# Patient Record
Sex: Female | Born: 1995 | Race: White | Hispanic: No | Marital: Single | Smoking: Never smoker
Health system: Southern US, Community
[De-identification: ages and names within clinical notes are randomized; demographics above are authoritative.]

## PROBLEM LIST (undated history)

## (undated) HISTORY — PX: WISDOM TOOTH EXTRACTION: SHX21

## (undated) HISTORY — PX: TONSILLECTOMY: SUR1361

---

## 2015-01-24 ENCOUNTER — Emergency Department
Admission: EM | Admit: 2015-01-24 | Discharge: 2015-01-24 | Discharge status: Absent without leave | Payer: BLUE CROSS/BLUE SHIELD | Source: Home / Self Care

## 2018-10-21 ENCOUNTER — Emergency Department (HOSPITAL_COMMUNITY)
Admission: EM | Admit: 2018-10-21 | Discharge: 2018-10-21 | Disposition: A | Discharge status: Home / Self Care | Payer: 59 | Source: Home / Self Care

## 2018-10-21 ENCOUNTER — Encounter (HOSPITAL_COMMUNITY): Payer: Self-pay

## 2018-10-21 ENCOUNTER — Other Ambulatory Visit: Payer: Self-pay

## 2018-10-21 ENCOUNTER — Emergency Department (HOSPITAL_COMMUNITY)
Admission: EM | Admit: 2018-10-21 | Discharge: 2018-10-21 | Disposition: A | Discharge status: Home / Self Care | Payer: 59 | Attending: Emergency Medicine | Admitting: Emergency Medicine

## 2018-10-21 ENCOUNTER — Emergency Department (HOSPITAL_COMMUNITY): Payer: 59

## 2018-10-21 DIAGNOSIS — Y9389 Activity, other specified: Secondary | ICD-10-CM | POA: Insufficient documentation

## 2018-10-21 DIAGNOSIS — S51811A Laceration without foreign body of right forearm, initial encounter: Secondary | ICD-10-CM | POA: Diagnosis not present

## 2018-10-21 DIAGNOSIS — Y929 Unspecified place or not applicable: Secondary | ICD-10-CM | POA: Insufficient documentation

## 2018-10-21 DIAGNOSIS — W260XXA Contact with knife, initial encounter: Secondary | ICD-10-CM | POA: Insufficient documentation

## 2018-10-21 DIAGNOSIS — Z23 Encounter for immunization: Secondary | ICD-10-CM | POA: Diagnosis not present

## 2018-10-21 DIAGNOSIS — Y999 Unspecified external cause status: Secondary | ICD-10-CM | POA: Diagnosis not present

## 2018-10-21 MED ORDER — TETANUS-DIPHTH-ACELL PERTUSSIS 5-2.5-18.5 LF-MCG/0.5 IM SUSP
0.5000 mL | Freq: Once | INTRAMUSCULAR | Status: AC
  Administered 2018-10-21: 0.5 mL via INTRAMUSCULAR
  Filled 2018-10-21: qty 0.5

## 2018-10-21 MED ORDER — LIDOCAINE-EPINEPHRINE (PF) 1 %-1:200000 IJ SOLN
10.0000 mL | Freq: Once | INTRAMUSCULAR | Status: DC
  Filled 2018-10-21: qty 30

## 2018-10-21 NOTE — ED Triage Notes (Signed)
Pt was skinning a deer and knife slipped, cut right forearm lac, 2 inch horizontal lac above right wrist. Pt states 'spurting' but bleeding controlled by the time EMS arrived. brother applied tourniquet @ 1815.

## 2018-10-21 NOTE — Discharge Instructions (Signed)
You were seen in the ED today with a forearm laceration. This was repaired with stitches that will need to come out in 7 days. You can return to the ED for removal or go to your PCP. Return to the ED with any sudden worsening pain, numbness, redness around the wound, drainage from the wound, or other sign of infection.

## 2018-10-21 NOTE — ED Provider Notes (Signed)
Emergency Department Provider Note   I have reviewed the triage vital signs and the nursing notes.   HISTORY  Chief Complaint Laceration   HPI Jocelyn Bray is a 22 y.o. female presents to the emergency department by EMS after sustaining a laceration to the right forearm.  Patient was hunting with family and cleaning a deer when the knife slipped and inadvertently cut her in the right wrist.  Patient notes significant bleeding on scene.  Her brother is a Emergency planning/management officerpolice officer and applied a belt-like a tourniquet to the right forearm and called paramedics.  Paramedics evaluated the wound, irrigated, dressed, and transported to the emergency department.  Patient denies any numbness or tingling in right hand or forearm.  No injury to other parts of the body.  Unsure of her last tetanus shot.    History reviewed. No pertinent past medical history.  There are no active problems to display for this patient.   Past Surgical History:  Procedure Laterality Date  . TONSILLECTOMY    . WISDOM TOOTH EXTRACTION      Allergies Patient has no allergy information on record.  History reviewed. No pertinent family history.  Social History Social History   Tobacco Use  . Smoking status: Never Smoker  Substance Use Topics  . Alcohol use: Not on file    Comment: occ  . Drug use: Not on file    Review of Systems  Constitutional: No fever/chills Eyes: No visual changes. ENT: No sore throat. Cardiovascular: Denies chest pain. Respiratory: Denies shortness of breath. Gastrointestinal: No abdominal pain.  No nausea, no vomiting.  No diarrhea.  No constipation. Genitourinary: Negative for dysuria. Musculoskeletal: Negative for back pain. Skin: Negative for rash. Right forearm laceration.  Neurological: Negative for headaches, focal weakness or numbness.  10-point ROS otherwise negative.  ____________________________________________   PHYSICAL EXAM:  VITAL SIGNS: ED Triage Vitals    Enc Vitals Group     BP 10/21/18 1922 (!) 143/106     Pulse Rate 10/21/18 1922 75     Resp 10/21/18 1922 18     Temp 10/21/18 1922 98.7 F (37.1 C)     Temp Source 10/21/18 1922 Oral     SpO2 10/21/18 1922 99 %     Weight 10/21/18 1923 210 lb (95.3 kg)     Height 10/21/18 1923 5\' 6"  (1.676 m)     Pain Score 10/21/18 1923 8   Constitutional: Alert and oriented. Well appearing and in no acute distress. Eyes: Conjunctivae are normal.  Head: Atraumatic. Nose: No congestion/rhinnorhea. Mouth/Throat: Mucous membranes are moist.  Oropharynx non-erythematous. Neck: No stridor.  Cardiovascular: Normal rate, regular rhythm. Good peripheral circulation. Grossly normal heart sounds.   Respiratory: Normal respiratory effort.  No retractions. Lungs CTAB. Gastrointestinal: No distention.  Musculoskeletal: No lower extremity tenderness nor edema. No gross deformities of extremities. Normal flexor and extensor strength in the forearm and hand on the right.  Neurologic:  Normal speech and language. No gross focal neurologic deficits are appreciated.  Skin:  Skin is warm and dry. 4 cm laceration to the right forearm. Palpable right radial pulse.   ____________________________________________  RADIOLOGY  Dg Forearm Right  Result Date: 10/21/2018 CLINICAL DATA:  Initial evaluation for acute trauma, laceration at forearm. EXAM: RIGHT FOREARM - 2 VIEW COMPARISON:  None. FINDINGS: Soft tissue laceration present at the anterior aspect of the distal right forearm. No radiopaque foreign body. No acute fracture or dislocation. IMPRESSION: 1. Soft tissue laceration at the anterior aspect  of the distal right forearm. No radiopaque foreign body. 2. No acute osseous abnormality. Electronically Signed   By: Rise Mu M.D.   On: 10/21/2018 20:12    ____________________________________________   PROCEDURES  Procedure(s) performed:   Marland KitchenMarland KitchenLaceration Repair Date/Time: 10/21/2018 8:40 PM Performed  by: Maia Plan, MD Authorized by: Maia Plan, MD   Consent:    Consent obtained:  Verbal   Consent given by:  Patient   Risks discussed:  Infection, need for additional repair, nerve damage, poor wound healing, poor cosmetic result, pain, retained foreign body, tendon damage and vascular damage   Alternatives discussed:  No treatment Anesthesia (see MAR for exact dosages):    Anesthesia method:  Local infiltration   Local anesthetic:  Lidocaine 1% WITH epi Laceration details:    Location:  Shoulder/arm   Shoulder/arm location:  R lower arm   Length (cm):  6 Repair type:    Repair type:  Intermediate Pre-procedure details:    Preparation:  Patient was prepped and draped in usual sterile fashion and imaging obtained to evaluate for foreign bodies Exploration:    Hemostasis achieved with:  Direct pressure   Wound exploration: wound explored through full range of motion and entire depth of wound probed and visualized     Wound extent: no foreign bodies/material noted, no muscle damage noted, no nerve damage noted, no tendon damage noted, no underlying fracture noted and no vascular damage noted     Contaminated: no   Treatment:    Area cleansed with:  Betadine and saline   Amount of cleaning:  Standard   Irrigation solution:  Sterile saline   Irrigation volume:  1000 ml   Visualized foreign bodies/material removed: no   Skin repair:    Repair method:  Sutures   Suture size:  4-0   Suture material:  Prolene   Suture technique:  Simple interrupted   Number of sutures:  6 Approximation:    Approximation:  Close Post-procedure details:    Dressing:  Open (no dressing)   Patient tolerance of procedure:  Tolerated well, no immediate complications     ____________________________________________   INITIAL IMPRESSION / ASSESSMENT AND PLAN / ED COURSE  Pertinent labs & imaging results that were available during my care of the patient were reviewed by me and considered in  my medical decision making (see chart for details).  Patient presents to the ED with accidental laceration to the right wrist. Hemostatic on arrival. Belt applied to arm on arrival but not tight enough to constrict arterial flow. Belt removed. Wound hemastatic. Normal distal arterial pulse and capillary refill. Normal strength and sensation. Wound irrigated copiously and cleaned. Laceration repair as above.   09:00 PM Shortly after discharge the patient returns with additional bleeding. Patient with hematoma under the laceration. Wound is still well-approximated with sutures intact. Normal radial pulse distal to the hematoma. Normal perfusion of the hand. Pressure applied and wrapped with coban dressing. Patient observed in the ED for additional period of time and ultimately discharged. Distal pulse, cap refill, and sensation are all intact with dressing in place. Discussed suture removal timeframe and ED return precautions.    ____________________________________________  FINAL CLINICAL IMPRESSION(S) / ED DIAGNOSES  Final diagnoses:  Laceration of right forearm, initial encounter     MEDICATIONS GIVEN DURING THIS VISIT:  Medications  Tdap (BOOSTRIX) injection 0.5 mL (0.5 mLs Intramuscular Given 10/21/18 1943)    Note:  This document was prepared using Dragon voice recognition software and  may include unintentional dictation errors.  Alona Bene, MD Emergency Medicine    Madalene Mickler, Arlyss Repress, MD 10/22/18 [phone]

## 2019-06-19 IMAGING — DX DG FOREARM 2V*R*
2 series · 2 of 2 positions shown · non-contrast
Comparison: None.

CLINICAL DATA: Initial evaluation for acute trauma, laceration at
forearm.

EXAM:
RIGHT FOREARM - 2 VIEW

[forearm ap]
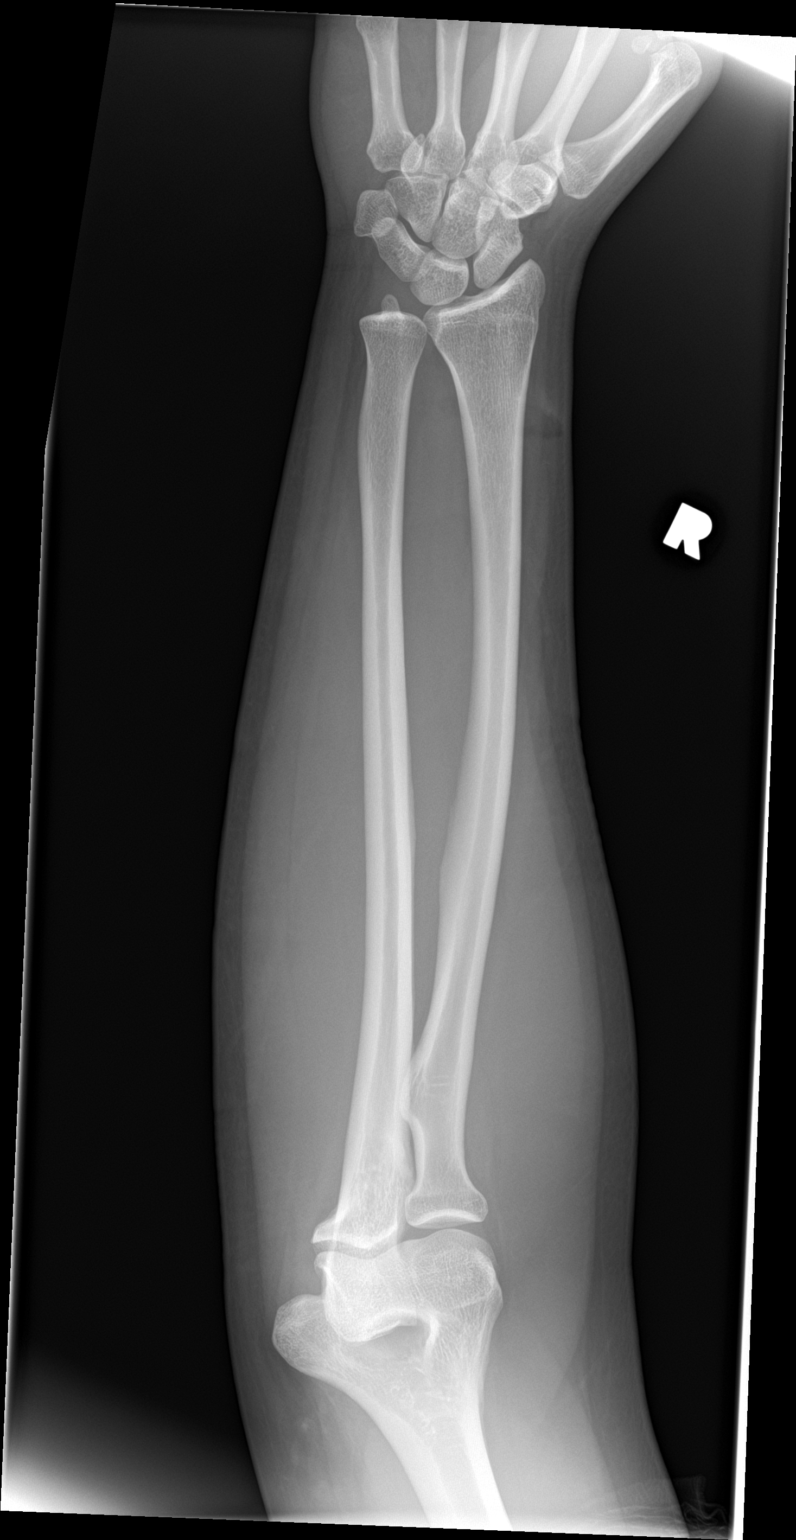

[forearm lat]
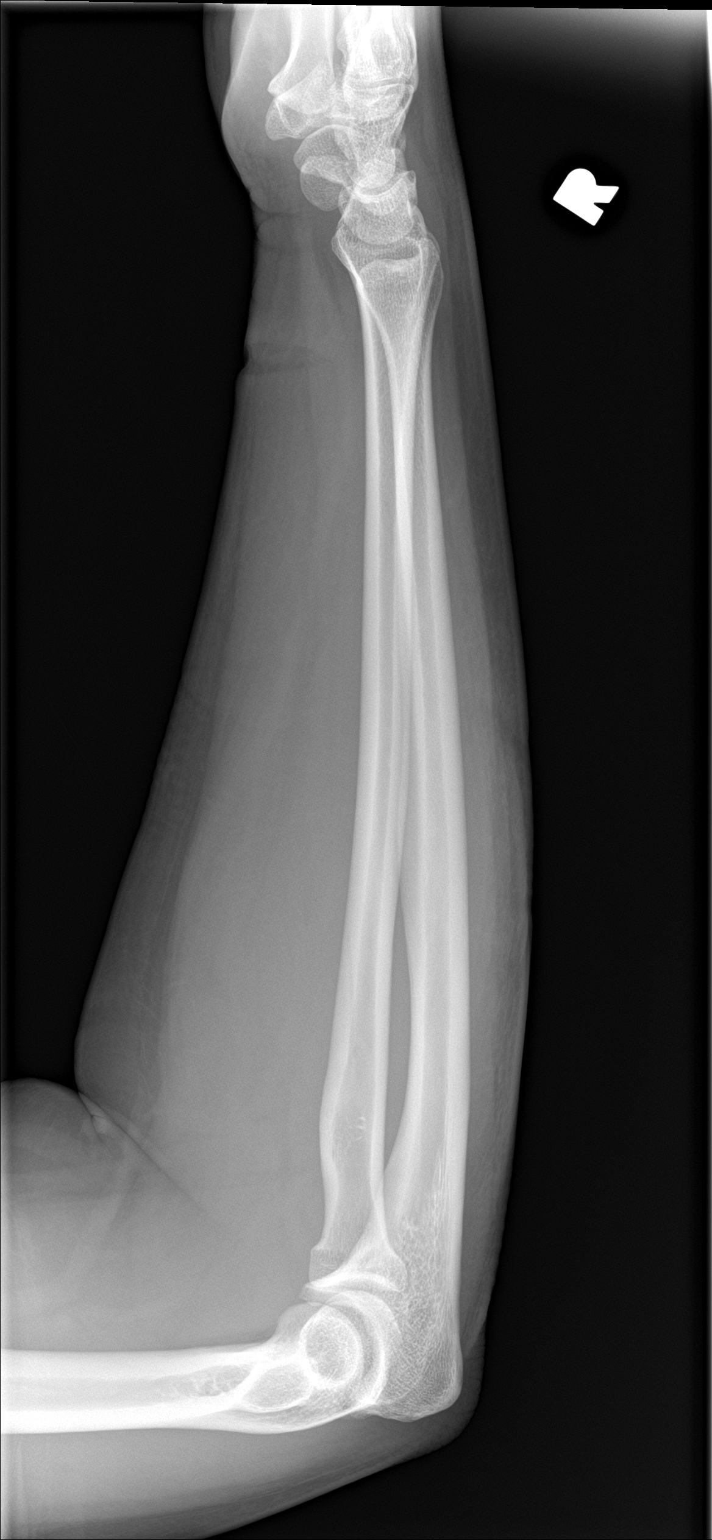

[2 of 2 positions shown; findings below may reference images not displayed]

FINDINGS: Soft tissue laceration present at the anterior aspect of the distal
right forearm. No radiopaque foreign body. No acute fracture or
dislocation.
IMPRESSION: 1. Soft tissue laceration at the anterior aspect of the distal right
forearm. No radiopaque foreign body.
2. No acute osseous abnormality.
